# Patient Record
Sex: Male | Born: 1970 | Race: White | Hispanic: No | Marital: Married | Smoking: Never smoker
Health system: Southern US, Community
[De-identification: ages and names within clinical notes are randomized; demographics above are authoritative.]

## PROBLEM LIST (undated history)

## (undated) DIAGNOSIS — R42 Dizziness and giddiness: Secondary | ICD-10-CM

## (undated) DIAGNOSIS — F988 Other specified behavioral and emotional disorders with onset usually occurring in childhood and adolescence: Secondary | ICD-10-CM

---

## 2014-06-19 ENCOUNTER — Emergency Department: Payer: Self-pay | Admitting: Emergency Medicine

## 2015-06-12 ENCOUNTER — Other Ambulatory Visit: Payer: Self-pay | Admitting: Family Medicine

## 2015-06-12 DIAGNOSIS — M25561 Pain in right knee: Secondary | ICD-10-CM

## 2015-06-17 ENCOUNTER — Ambulatory Visit
Admission: RE | Admit: 2015-06-17 | Discharge: 2015-06-17 | Disposition: A | Discharge status: Home / Self Care | Payer: Federal, State, Local not specified - PPO | Source: Ambulatory Visit | Attending: Family Medicine | Admitting: Family Medicine

## 2015-06-17 DIAGNOSIS — M25561 Pain in right knee: Secondary | ICD-10-CM | POA: Insufficient documentation

## 2016-01-28 ENCOUNTER — Emergency Department
Admission: EM | Admit: 2016-01-28 | Discharge: 2016-01-28 | Disposition: A | Discharge status: Home / Self Care | Payer: Federal, State, Local not specified - PPO | Attending: Emergency Medicine | Admitting: Emergency Medicine

## 2016-01-28 ENCOUNTER — Encounter: Payer: Self-pay | Admitting: Emergency Medicine

## 2016-01-28 ENCOUNTER — Emergency Department: Payer: Federal, State, Local not specified - PPO

## 2016-01-28 DIAGNOSIS — F909 Attention-deficit hyperactivity disorder, unspecified type: Secondary | ICD-10-CM | POA: Insufficient documentation

## 2016-01-28 DIAGNOSIS — Z79899 Other long term (current) drug therapy: Secondary | ICD-10-CM | POA: Diagnosis not present

## 2016-01-28 DIAGNOSIS — R42 Dizziness and giddiness: Secondary | ICD-10-CM | POA: Diagnosis present

## 2016-01-28 HISTORY — DX: Dizziness and giddiness: R42

## 2016-01-28 HISTORY — DX: Other specified behavioral and emotional disorders with onset usually occurring in childhood and adolescence: F98.8

## 2016-01-28 LAB — BASIC METABOLIC PANEL
Anion gap: 6 (ref 5–15)
BUN: 8 mg/dL (ref 6–20)
CALCIUM: 9.4 mg/dL (ref 8.9–10.3)
CO2: 25 mmol/L (ref 22–32)
Chloride: 105 mmol/L (ref 101–111)
Creatinine, Ser: 0.98 mg/dL (ref 0.61–1.24)
Glucose, Bld: 129 mg/dL — ABNORMAL HIGH (ref 65–99)
Potassium: 4.1 mmol/L (ref 3.5–5.1)
SODIUM: 136 mmol/L (ref 135–145)

## 2016-01-28 MED ORDER — MECLIZINE HCL 25 MG PO TABS
25.0000 mg | ORAL_TABLET | Freq: Once | ORAL | Status: AC
  Administered 2016-01-28: 25 mg via ORAL
  Filled 2016-01-28: qty 1

## 2016-01-28 MED ORDER — GADOBENATE DIMEGLUMINE 529 MG/ML IV SOLN
20.0000 mL | Freq: Once | INTRAVENOUS | Status: AC | PRN
  Administered 2016-01-28: 20 mL via INTRAVENOUS

## 2016-01-28 MED ORDER — DIAZEPAM 5 MG PO TABS: 5.0000 mg | ORAL_TABLET | Freq: Three times a day (TID) | ORAL | Status: DC | PRN

## 2016-01-28 MED ORDER — SODIUM CHLORIDE 0.9 % IV BOLUS (SEPSIS)
1000.0000 mL | INTRAVENOUS | Status: AC
  Administered 2016-01-28: 1000 mL via INTRAVENOUS

## 2016-01-28 MED ORDER — MECLIZINE HCL 25 MG PO TABS: 25.0000 mg | ORAL_TABLET | Freq: Three times a day (TID) | ORAL | Status: DC | PRN

## 2016-01-28 MED ORDER — DIAZEPAM 5 MG PO TABS
5.0000 mg | ORAL_TABLET | Freq: Once | ORAL | Status: AC
  Administered 2016-01-28: 5 mg via ORAL
  Filled 2016-01-28: qty 1
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
## 2016-01-28 NOTE — ED Notes (Signed)
Patient transported to MRI 

## 2016-01-28 NOTE — Discharge Instructions (Signed)
We believe your symptoms were caused by benign vertigo.  Please read through the included information and take any prescribed medication(s).  Follow up with your doctor as listed above.  We performed an MRI and MRA of your brain today, and the results were reassuring.  You have been provided with a CD with the images so you can take it to your regular doctor and the neurologist to whom she will refer you.  If you develop any new or worsening symptoms that concern you, including but not limited to persistent dizziness/vertigo, numbness or weakness in your arms or legs, altered mental status, persistent vomiting, or fever greater than 101, please return immediately to the Emergency Department.   Benign Positional Vertigo Vertigo is the feeling that you or your surroundings are moving when they are not. Benign positional vertigo is the most common form of vertigo. The cause of this condition is not serious (is benign). This condition is triggered by certain movements and positions (is positional). This condition can be dangerous if it occurs while you are doing something that could endanger you or others, such as driving.  CAUSES In many cases, the cause of this condition is not known. It may be caused by a disturbance in an area of the inner ear that helps your brain to sense movement and balance. This disturbance can be caused by a viral infection (labyrinthitis), head injury, or repetitive motion. RISK FACTORS This condition is more likely to develop in:  Women.  People who are 43 years of age or older. SYMPTOMS Symptoms of this condition usually happen when you move your head or your eyes in different directions. Symptoms may start suddenly, and they usually last for less than a minute. Symptoms may include:  Loss of balance and falling.  Feeling like you are spinning or moving.  Feeling like your surroundings are spinning or moving.  Nausea and vomiting.  Blurred  vision.  Dizziness.  Involuntary eye movement (nystagmus). Symptoms can be mild and cause only slight annoyance, or they can be severe and interfere with daily life. Episodes of benign positional vertigo may return (recur) over time, and they may be triggered by certain movements. Symptoms may improve over time. DIAGNOSIS This condition is usually diagnosed by medical history and a physical exam of the head, neck, and ears. You may be referred to a health care provider who specializes in ear, nose, and throat (ENT) problems (otolaryngologist) or a provider who specializes in disorders of the nervous system (neurologist). You may have additional testing, including:  MRI.  A CT scan.  Eye movement tests. Your health care provider may ask you to change positions quickly while he or she watches you for symptoms of benign positional vertigo, such as nystagmus. Eye movement may be tested with an electronystagmogram (ENG), caloric stimulation, the Dix-Hallpike test, or the roll test.  An electroencephalogram (EEG). This records electrical activity in your brain.  Hearing tests. TREATMENT Usually, your health care provider will treat this by moving your head in specific positions to adjust your inner ear back to normal. Surgery may be needed in severe cases, but this is rare. In some cases, benign positional vertigo may resolve on its own in 2-4 weeks. HOME CARE INSTRUCTIONS Safety  Move slowly.Avoid sudden body or head movements.  Avoid driving.  Avoid operating heavy machinery.  Avoid doing any tasks that would be dangerous to you or others if a vertigo episode would occur.  If you have trouble walking or keeping your  balance, try using a cane for stability. If you feel dizzy or unstable, sit down right away.  Return to your normal activities as told by your health care provider. Ask your health care provider what activities are safe for you. General Instructions  Take over-the-counter  and prescription medicines only as told by your health care provider.  Avoid certain positions or movements as told by your health care provider.  Drink enough fluid to keep your urine clear or pale yellow.  Keep all follow-up visits as told by your health care provider. This is important. SEEK MEDICAL CARE IF:  You have a fever.  Your condition gets worse or you develop new symptoms.  Your family or friends notice any behavioral changes.  Your nausea or vomiting gets worse.  You have numbness or a "pins and needles" sensation. SEEK IMMEDIATE MEDICAL CARE IF:  You have difficulty speaking or moving.  You are always dizzy.  You faint.  You develop severe headaches.  You have weakness in your legs or arms.  You have changes in your hearing or vision.  You develop a stiff neck.  You develop sensitivity to light.   This information is not intended to replace advice given to you by your health care provider. Make sure you discuss any questions you have with your health care provider.   Document Released: 07/13/2006 Document Revised: 06/26/2015 Document Reviewed: 01/28/2015 Elsevier Interactive Patient Education [employer]

## 2016-01-28 NOTE — ED Notes (Signed)
Pt to ed with c/o dizziness that started this am.  Pt states he has been receiving treatment for vertigo for the last several months.

## 2016-01-28 NOTE — ED Notes (Signed)
This RN called Lab to inquire about blood, Will process orders now

## 2016-10-19 ENCOUNTER — Emergency Department
Admission: EM | Admit: 2016-10-19 | Discharge: 2016-10-19 | Disposition: A | Discharge status: Home / Self Care | Attending: Emergency Medicine | Admitting: Emergency Medicine

## 2016-10-19 ENCOUNTER — Emergency Department

## 2016-10-19 DIAGNOSIS — J168 Pneumonia due to other specified infectious organisms: Secondary | ICD-10-CM | POA: Diagnosis not present

## 2016-10-19 DIAGNOSIS — R05 Cough: Secondary | ICD-10-CM | POA: Diagnosis present

## 2016-10-19 DIAGNOSIS — J181 Lobar pneumonia, unspecified organism: Secondary | ICD-10-CM

## 2016-10-19 DIAGNOSIS — J189 Pneumonia, unspecified organism: Secondary | ICD-10-CM

## 2016-10-19 MED ORDER — BENZONATATE 100 MG PO CAPS
ORAL_CAPSULE | ORAL | Status: AC
  Filled 2016-10-19: qty 2

## 2016-10-19 MED ORDER — BENZONATATE 100 MG PO CAPS: 100.0000 mg | ORAL_CAPSULE | Freq: Three times a day (TID) | ORAL | 0 refills | Status: AC | PRN

## 2016-10-19 MED ORDER — AZITHROMYCIN 250 MG PO TABS: ORAL_TABLET | ORAL | 0 refills | Status: AC

## 2016-10-19 MED ORDER — AMOXICILLIN 500 MG PO CAPS
ORAL_CAPSULE | ORAL | Status: AC
  Filled 2016-10-19: qty 1

## 2016-10-19 MED ORDER — BENZONATATE 100 MG PO CAPS
200.0000 mg | ORAL_CAPSULE | Freq: Once | ORAL | Status: AC
  Administered 2016-10-19: 200 mg via ORAL

## 2016-10-19 MED ORDER — AZITHROMYCIN 500 MG PO TABS
500.0000 mg | ORAL_TABLET | Freq: Once | ORAL | Status: AC
  Administered 2016-10-19: 500 mg via ORAL
  Filled 2016-10-19: qty 1

## 2016-10-19 NOTE — ED Triage Notes (Signed)
Cough X 3 days. Pt has not taken any OTC medications PTA. Pt alert and oriented X4, active, cooperative, pt in NAD. RR even and unlabored, color WNL.

## 2016-10-20 NOTE — ED Provider Notes (Signed)
[facility] Emergency Department Provider Note  ____________________________________________  Time seen: Approximately 12:14 PM  I have reviewed the triage vital signs and the nursing notes.   HISTORY  Chief Complaint Cough    HPI Marcus Burke is a 46 y.o. male presenting to the emergency department with nonproductive cough. Patient states that he has had nonproductive cough for approximately 1 month. However non productive cough acutely worsened 3 days ago. Cough is keeping him awake at night. Patient's only additional symptom is persistent fatigue. He denies purulent sputum production or dyspnea. Patient states that he has a history of chemical exposures with his job in [employer] He denies trauma, chest pain, fever, nausea and vomiting. Patient has not attempted alleviating measures.   Past Medical History:  Diagnosis Date  . ADD (attention deficit disorder)   . Vertigo     There are no active problems to display for this patient.   History reviewed. No pertinent surgical history.  Prior to Admission medications   Medication Sig Start Date End Date Taking? Authorizing Provider  amphetamine-dextroamphetamine (ADDERALL XR) 30 MG 24 hr capsule Take 30 mg by mouth daily.    Historical Provider, MD  azithromycin (ZITHROMAX Z-PAK) 250 MG tablet Take 2 tablets (500 mg) on  Day 1,  followed by 1 tablet (250 mg) once daily on Days 2 through 5. 10/19/16 10/24/16  Orvil Feil, PA-C  benzonatate (TESSALON PERLES) 100 MG capsule Take 1 capsule (100 mg total) by mouth 3 (three) times daily as needed for cough. 10/19/16 10/24/16  Orvil Feil, PA-C  diazepam (VALIUM) 5 MG tablet Take 1 tablet (5 mg total) by mouth every 8 (eight) hours as needed (vertigo). 01/28/16 01/27/17  Loleta Rose, MD  meclizine (ANTIVERT) 25 MG tablet Take 1 tablet (25 mg total) by mouth 3 (three) times daily as needed for dizziness. 01/28/16   Loleta Rose, MD  methylphenidate (RITALIN) 10 MG  tablet Take 10 mg by mouth daily.    Historical Provider, MD    Allergies Penicillins  No family history on file.  Social History Social History  Substance Use Topics  . Smoking status: Never Smoker  . Smokeless tobacco: Never Used  . Alcohol use No     Review of Systems  Constitutional: No fever/chills Eyes: No visual changes. No discharge ENT: No congestion or rhinorrhea Cardiovascular: no chest pain. Respiratory: Has non productive cough. No SOB. Skin: Negative for rash, abrasions, lacerations, ecchymosis. Neurological: Negative for headaches, focal weakness or numbness. 10-point ROS otherwise negative.  ____________________________________________   PHYSICAL EXAM:  VITAL SIGNS: ED Triage Vitals  Enc Vitals Group     BP 10/19/16 1833 131/79     Pulse Rate 10/19/16 1833 (!) 102     Resp 10/19/16 1833 18     Temp 10/19/16 1833 98.5 F (36.9 C)     Temp Source 10/19/16 1833 Oral     SpO2 10/19/16 1833 99 %     Weight 10/19/16 1833 200 lb (90.7 kg)     Height 10/19/16 1833 5\' 9"  (1.753 m)     Head Circumference --      Peak Flow --      Pain Score 10/19/16 2116 0     Pain Loc --      Pain Edu? --      Excl. in GC? --      Constitutional: Alert and oriented. Patient is coughing throughout encounter. Eyes: Conjunctivae are normal. PERRL. EOMI. Head: Atraumatic. ENT:  Ears:  Tympanic membranes are pearly bilaterally. No evidence of infection or effusion. Bony landmarks are visualized bilaterally.      Nose: No congestion/rhinnorhea.      Mouth/Throat: Mucous membranes are moist.   Posterior pharynx is nonerythematous. Neck:  Full range of motion.  Hematological/Lymphatic/Immunilogical: No cervical lymphadenopathy. Cardiovascular: Tachycardia, regular rhythm. Normal S1 and S2. Good peripheral circulation. Respiratory: Normal respiratory effort without tachypnea or retractions. Patient has fine crackles auscultated at the right lung base. Good air entry  to the bases with no decreased or absent breath sounds. Neurologic:  Normal speech and language. No gross focal neurologic deficits are appreciated.  Skin:  Skin is warm, dry and intact. No rash noted. Psychiatric: Mood and affect are normal. Speech and behavior are normal. Patient exhibits appropriate insight and judgement.   ____________________________________________   LABS (all labs ordered are listed, but only abnormal results are displayed)  Labs Reviewed - No data to display ____________________________________________  EKG   ____________________________________________  RADIOLOGY  Orvil FeilJaclyn M Khang Hannum, personally viewed and evaluated these images (plain radiographs) as part of my medical decision making, as well as reviewing the written report by the radiologist.  Dg Chest 2 View  Result Date: 10/19/2016 CLINICAL DATA:  Three day history of cough. EXAM: CHEST  2 VIEW COMPARISON:  None. FINDINGS: The cardiac silhouette, mediastinal and hilar contours are within normal limits. Right middle lobe airspace opacity consistent with pneumonia. No pleural effusions. The left lung is clear. The bony thorax is intact. IMPRESSION: Right middle lobe pneumonia. Electronically Signed   By: Rudie MeyerP.  Gallerani M.D.   On: 10/19/2016 20:34    ____________________________________________    PROCEDURES  Procedure(s) performed:    Procedures    Medications  azithromycin (ZITHROMAX) tablet 500 mg (500 mg Oral Given 10/19/16 2114)  benzonatate (TESSALON) capsule 200 mg (200 mg Oral Given 10/19/16 2114)     ____________________________________________   INITIAL IMPRESSION / ASSESSMENT AND PLAN / ED COURSE  Pertinent labs & imaging results that were available during my care of the patient were reviewed by me and considered in my medical decision making (see chart for details).  Review of the [place] CSRS was performed in accordance of the [facility] prior to dispensing any controlled drugs.  Clinical  Course    Assessment and plan: Right Middle Lobe Pneumonia: Patient presents to the emergency department with persistent cough for the past month that acutely worsened 3 days ago. Patient has also had fatigue. DG chest reveals findings consistent with right middle lobe pneumonia. Patient was discharged with azithromycin. Patient was advised to follow-up with his primary care provider in one week. Vital signs are reassuring at this time. Heart rate was reassessed prior to discharge at 90 beats per minute. All patient questions were answered.  ____________________________________________  FINAL CLINICAL IMPRESSION(S) / ED DIAGNOSES  Final diagnoses:  Pneumonia of right middle lobe due to infectious organism [facility](HCC)      NEW MEDICATIONS STARTED DURING THIS VISIT:  Discharge Medication List as of 10/19/2016  8:56 PM    START taking these medications   Details  azithromycin (ZITHROMAX Z-PAK) 250 MG tablet Take 2 tablets (500 mg) on  Day 1,  followed by 1 tablet (250 mg) once daily on Days 2 through 5., Print            This chart was dictated using voice recognition software/Dragon. Despite best efforts to proofread, errors can occur which can change the meaning. Any change was purely unintentional.  Orvil Feil, PA-C 10/20/16 1233    Jeanmarie Plant, MD 10/22/16 2233

## 2016-12-21 ENCOUNTER — Emergency Department
Admission: EM | Admit: 2016-12-21 | Discharge: 2016-12-21 | Disposition: A | Discharge status: Home / Self Care | Attending: Emergency Medicine | Admitting: Emergency Medicine

## 2016-12-21 DIAGNOSIS — M5442 Lumbago with sciatica, left side: Secondary | ICD-10-CM | POA: Diagnosis not present

## 2016-12-21 DIAGNOSIS — M545 Low back pain: Secondary | ICD-10-CM | POA: Diagnosis present

## 2016-12-21 MED ORDER — CYCLOBENZAPRINE HCL 5 MG PO TABS: 5.0000 mg | ORAL_TABLET | Freq: Three times a day (TID) | ORAL | 0 refills | Status: AC | PRN

## 2016-12-21 MED ORDER — KETOROLAC TROMETHAMINE 60 MG/2ML IM SOLN
60.0000 mg | Freq: Once | INTRAMUSCULAR | Status: AC
  Administered 2016-12-21: 60 mg via INTRAMUSCULAR
  Filled 2016-12-21: qty 2

## 2016-12-21 MED ORDER — IBUPROFEN 800 MG PO TABS: 800.0000 mg | ORAL_TABLET | Freq: Three times a day (TID) | ORAL | 0 refills | Status: AC | PRN

## 2016-12-21 MED ORDER — ORPHENADRINE CITRATE 30 MG/ML IJ SOLN
60.0000 mg | Freq: Two times a day (BID) | INTRAMUSCULAR | Status: DC
  Administered 2016-12-21: 60 mg via INTRAMUSCULAR
  Filled 2016-12-21: qty 2
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
## 2016-12-21 NOTE — ED Triage Notes (Signed)
Pt reports pain in lower back, sts that he picked up 5 gallon bucket yesterday. Pt ambulatory to triage w/o issue. Resp even and unlabored.

## 2016-12-21 NOTE — ED Notes (Signed)
See triage note  States he has had lower back pain for several days w/o injury states pain has radiated into both hips  Then yesterday he picked a 5 gallon water bottle and felt pop to back  Ambulates slowly but w/o limp

## 2017-10-14 IMAGING — CR DG CHEST 2V
1 series · 2 of 2 positions shown · non-contrast
Comparison: None.

CLINICAL DATA: Three day history of cough.

EXAM:
CHEST  2 VIEW

[Series 1: dg chest 2 view · 0.14mm/px · 2 of 2 slices shown]
[im 1/2]
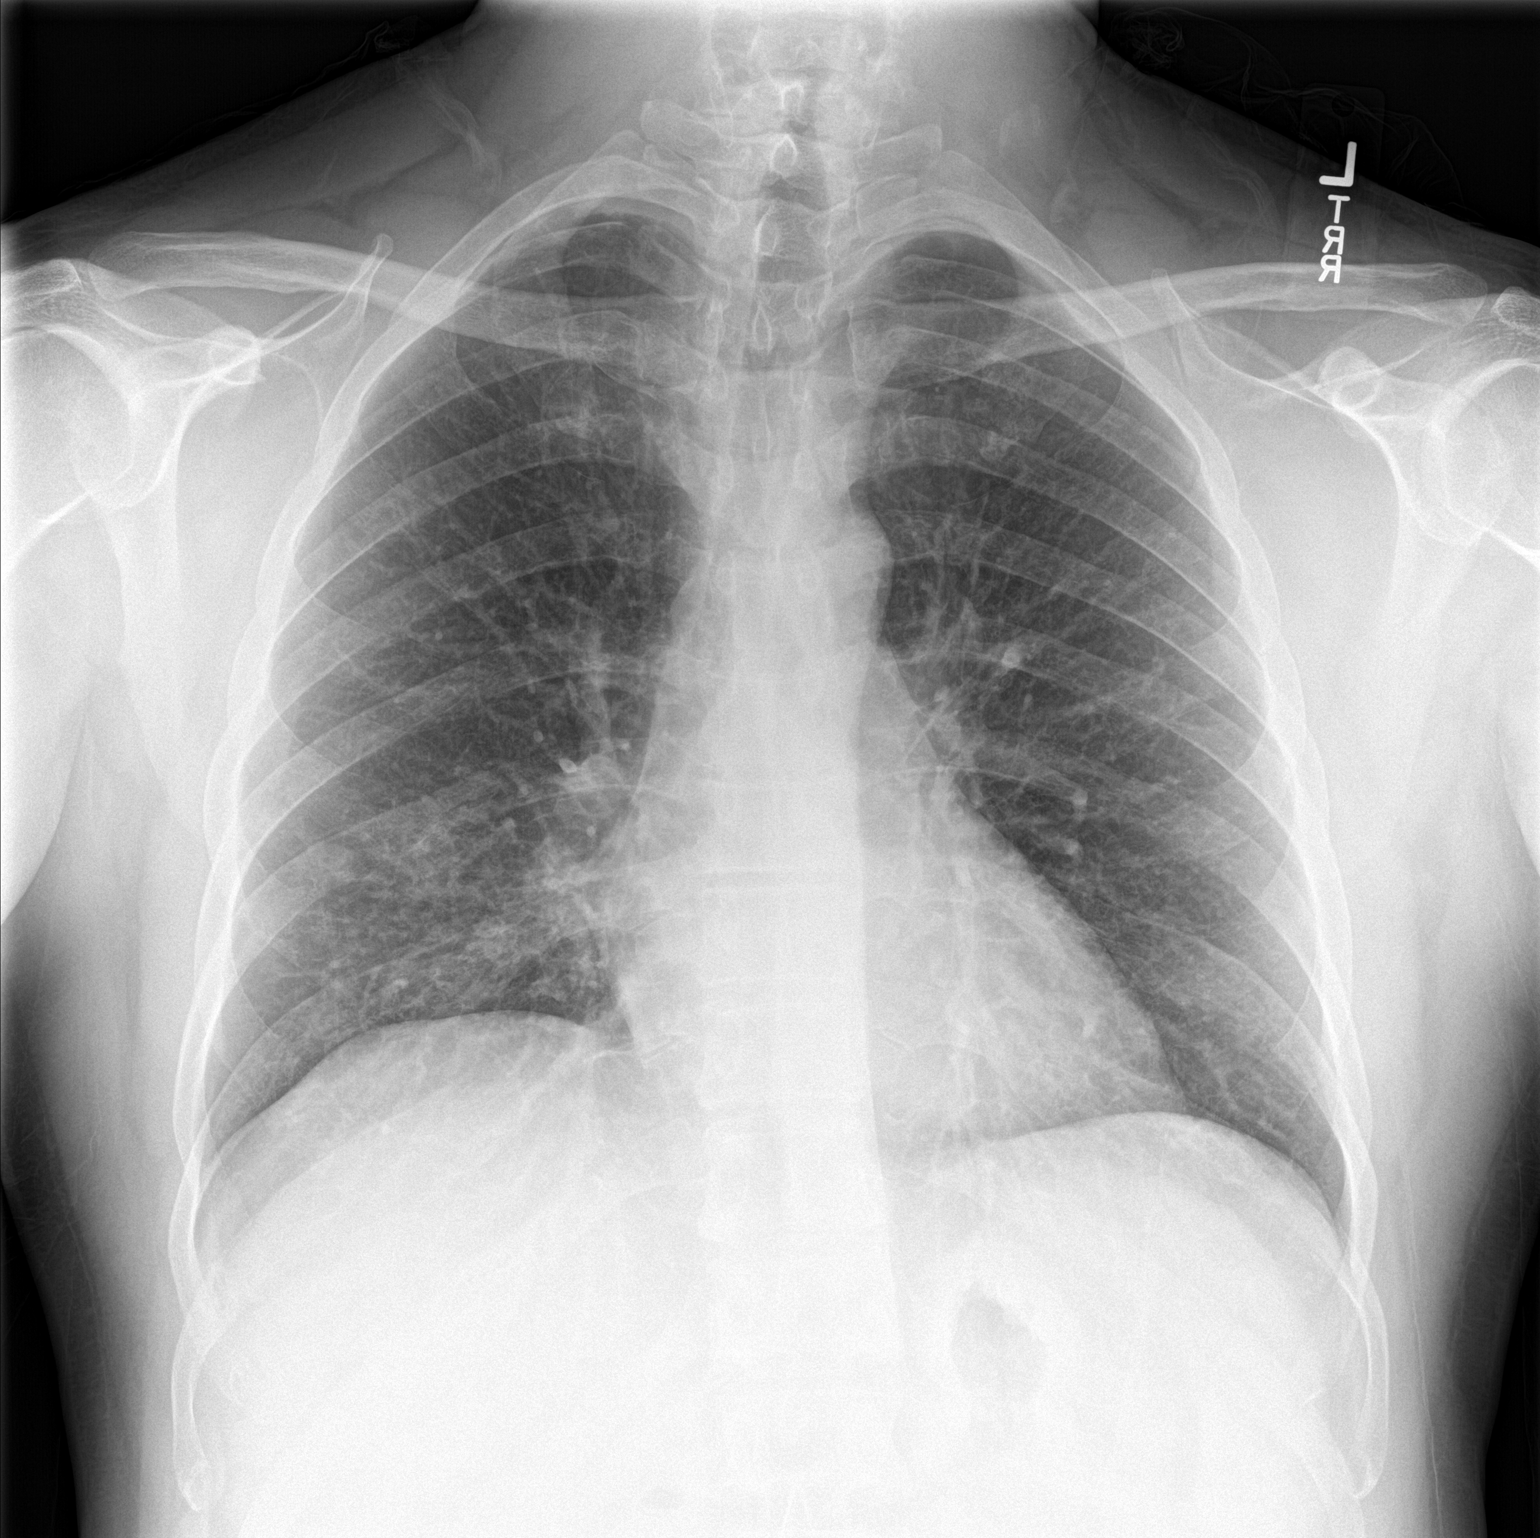
[im 2/2]
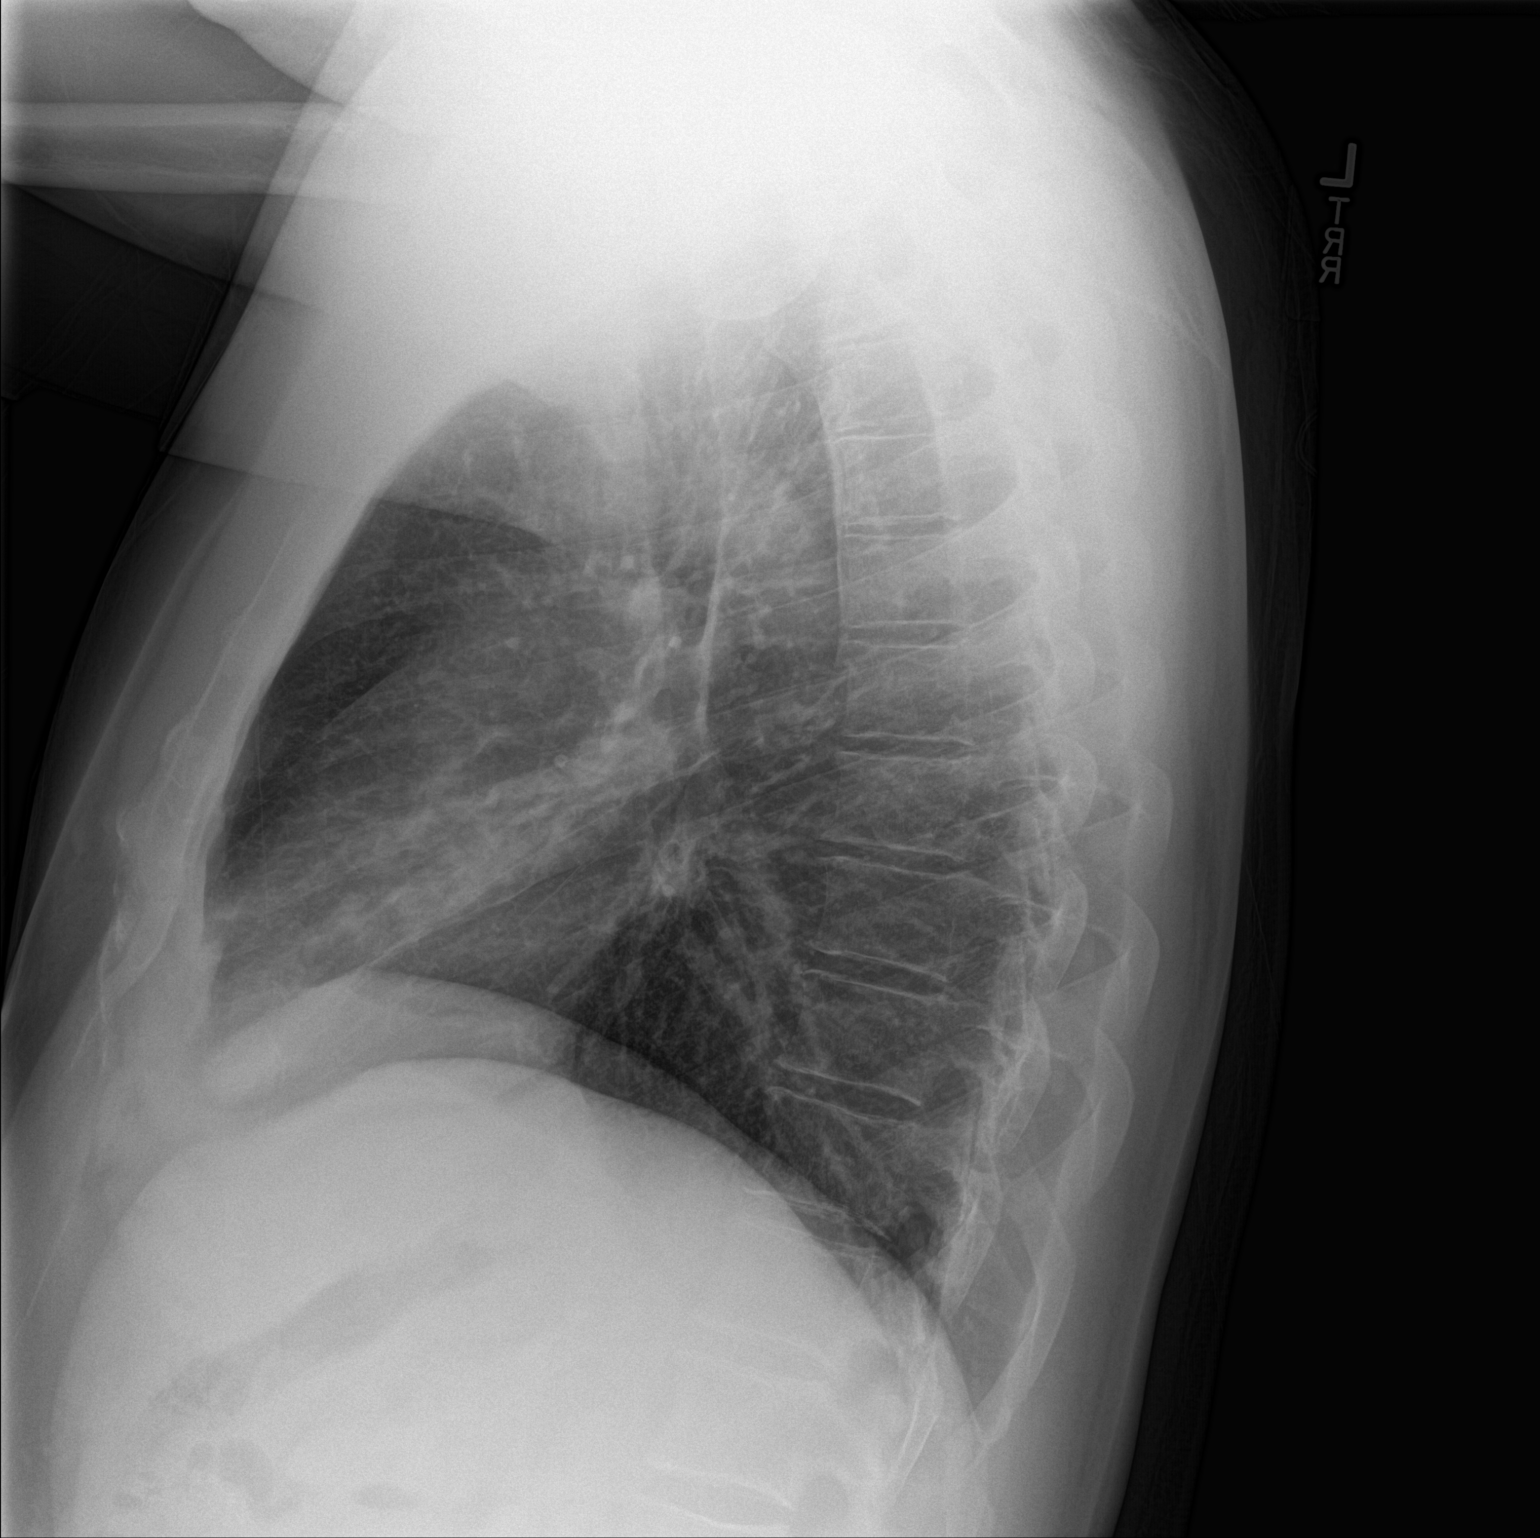

[2 of 2 positions shown; findings below may reference images not displayed]

FINDINGS: The cardiac silhouette, mediastinal and hilar contours are within
normal limits. Right middle lobe airspace opacity consistent with
pneumonia. No pleural effusions. The left lung is clear. The bony
thorax is intact.
IMPRESSION: Right middle lobe pneumonia.
